# Patient Record
Sex: Male | Born: 1945 | Race: White | Hispanic: No | Marital: Married | Smoking: Never smoker
Health system: Southern US, Community
[De-identification: ages and names within clinical notes are randomized; demographics above are authoritative.]

## PROBLEM LIST (undated history)

## (undated) DIAGNOSIS — E785 Hyperlipidemia, unspecified: Secondary | ICD-10-CM

## (undated) DIAGNOSIS — I10 Essential (primary) hypertension: Secondary | ICD-10-CM

## (undated) DIAGNOSIS — I4891 Unspecified atrial fibrillation: Secondary | ICD-10-CM

## (undated) HISTORY — DX: Essential (primary) hypertension: I10

## (undated) HISTORY — DX: Hyperlipidemia, unspecified: E78.5

## (undated) HISTORY — DX: Unspecified atrial fibrillation: I48.91

---

## 2004-10-20 ENCOUNTER — Ambulatory Visit: Payer: Self-pay | Admitting: Internal Medicine

## 2004-11-03 ENCOUNTER — Ambulatory Visit: Payer: Self-pay | Admitting: Internal Medicine

## 2009-03-10 ENCOUNTER — Telehealth: Payer: Self-pay | Admitting: Internal Medicine

## 2009-03-11 ENCOUNTER — Ambulatory Visit: Payer: Self-pay | Admitting: Gastroenterology

## 2009-03-11 DIAGNOSIS — I4891 Unspecified atrial fibrillation: Secondary | ICD-10-CM | POA: Insufficient documentation

## 2009-03-11 DIAGNOSIS — I10 Essential (primary) hypertension: Secondary | ICD-10-CM | POA: Insufficient documentation

## 2009-03-11 DIAGNOSIS — E0590 Thyrotoxicosis, unspecified without thyrotoxic crisis or storm: Secondary | ICD-10-CM | POA: Insufficient documentation

## 2009-03-13 ENCOUNTER — Telehealth (INDEPENDENT_AMBULATORY_CARE_PROVIDER_SITE_OTHER): Payer: Self-pay

## 2009-03-13 ENCOUNTER — Ambulatory Visit: Payer: Self-pay | Admitting: Physician Assistant

## 2009-03-13 LAB — CONVERTED CEMR LAB
Basophils Absolute: 0 10*3/uL (ref 0.0–0.1)
Eosinophils Absolute: 0.2 10*3/uL (ref 0.0–0.7)
HCT: 27.2 % — ABNORMAL LOW (ref 39.0–52.0)
Lymphs Abs: 1.5 10*3/uL (ref 0.7–4.0)
MCHC: 33.5 g/dL (ref 30.0–36.0)
MCV: 94.4 fL (ref 78.0–100.0)
Monocytes Absolute: 0.6 10*3/uL (ref 0.1–1.0)
Neutrophils Relative %: 69 % (ref 43.0–77.0)
Platelets: 200 10*3/uL (ref 150.0–400.0)
RDW: 12.6 % (ref 11.5–14.6)

## 2009-03-17 ENCOUNTER — Ambulatory Visit: Payer: Self-pay | Admitting: Internal Medicine

## 2009-03-17 LAB — CONVERTED CEMR LAB
BUN: 14 mg/dL (ref 6–23)
Basophils Relative: 0.4 % (ref 0.0–3.0)
Calcium: 8.7 mg/dL (ref 8.4–10.5)
Eosinophils Absolute: 0.2 10*3/uL (ref 0.0–0.7)
Eosinophils Relative: 3.8 % (ref 0.0–5.0)
Eosinophils Relative: 3 % (ref 0.0–5.0)
GFR calc non Af Amer: 103.98 mL/min (ref >60)
Glucose, Bld: 115 mg/dL — ABNORMAL HIGH (ref 70–99)
HCT: 33.5 % — ABNORMAL LOW (ref 39.0–52.0)
Lymphocytes Relative: 19.6 % (ref 12.0–46.0)
Lymphs Abs: 1.5 10*3/uL (ref 0.7–4.0)
MCHC: 34 g/dL (ref 30.0–36.0)
MCV: 94.3 fL (ref 78.0–100.0)
MCV: 93.5 fL (ref 78.0–100.0)
Monocytes Absolute: 0.5 10*3/uL (ref 0.1–1.0)
Monocytes Absolute: 0.7 10*3/uL (ref 0.1–1.0)
Monocytes Relative: 10.1 % (ref 3.0–12.0)
Neutrophils Relative %: 62.8 % (ref 43.0–77.0)
Neutrophils Relative %: 66.4 % (ref 43.0–77.0)
Platelets: 200 10*3/uL (ref 150.0–400.0)
Platelets: 252 10*3/uL (ref 150.0–400.0)
Prothrombin Time: 11.6 s (ref 10.9–13.3)
RBC: 3.56 M/uL — ABNORMAL LOW (ref 4.22–5.81)
RBC: 2.93 M/uL — ABNORMAL LOW (ref 4.22–5.81)
WBC: 6.1 10*3/uL (ref 4.5–10.5)
WBC: 7.3 10*3/uL (ref 4.5–10.5)

## 2009-03-20 ENCOUNTER — Ambulatory Visit: Payer: Self-pay | Admitting: Internal Medicine

## 2009-03-20 DIAGNOSIS — K5731 Diverticulosis of large intestine without perforation or abscess with bleeding: Secondary | ICD-10-CM

## 2009-03-20 DIAGNOSIS — D62 Acute posthemorrhagic anemia: Secondary | ICD-10-CM

## 2009-03-24 ENCOUNTER — Encounter (INDEPENDENT_AMBULATORY_CARE_PROVIDER_SITE_OTHER): Payer: Self-pay

## 2009-03-24 ENCOUNTER — Ambulatory Visit: Payer: Self-pay | Admitting: Internal Medicine

## 2009-03-24 ENCOUNTER — Ambulatory Visit (HOSPITAL_COMMUNITY): Admission: RE | Admit: 2009-03-24 | Discharge: 2009-03-24 | Payer: Self-pay | Admitting: Internal Medicine

## 2009-04-27 ENCOUNTER — Ambulatory Visit: Payer: Self-pay | Admitting: Internal Medicine

## 2009-07-27 LAB — CONVERTED CEMR LAB
Basophils Relative: 0.4 % (ref 0.0–3.0)
Eosinophils Relative: 2.9 % (ref 0.0–5.0)
Hemoglobin: 13 g/dL (ref 13.0–17.0)
MCV: 91.3 fL (ref 78.0–100.0)
Monocytes Absolute: 0.6 10*3/uL (ref 0.1–1.0)
Neutro Abs: 4.1 10*3/uL (ref 1.4–7.7)
Neutrophils Relative %: 63.2 % (ref 43.0–77.0)
RBC: 4.16 M/uL — ABNORMAL LOW (ref 4.22–5.81)
WBC: 6.5 10*3/uL (ref 4.5–10.5)

## 2009-07-28 ENCOUNTER — Ambulatory Visit: Payer: Self-pay | Admitting: Internal Medicine

## 2009-07-29 LAB — CONVERTED CEMR LAB
Basophils Relative: 0.7 % (ref 0.0–3.0)
Eosinophils Relative: 2.1 % (ref 0.0–5.0)
HCT: 41.8 % (ref 39.0–52.0)
Lymphs Abs: 1.7 10*3/uL (ref 0.7–4.0)
MCV: 93.7 fL (ref 78.0–100.0)
Monocytes Absolute: 0.8 10*3/uL (ref 0.1–1.0)
RBC: 4.46 M/uL (ref 4.22–5.81)
WBC: 6.5 10*3/uL (ref 4.5–10.5)

## 2010-08-25 ENCOUNTER — Ambulatory Visit: Payer: Self-pay | Admitting: Cardiovascular Disease

## 2011-01-16 ENCOUNTER — Other Ambulatory Visit: Payer: Self-pay | Admitting: Cardiovascular Disease

## 2011-01-17 ENCOUNTER — Other Ambulatory Visit: Payer: Self-pay | Admitting: Cardiovascular Disease

## 2011-01-17 MED ORDER — PROPRANOLOL HCL 10 MG PO TABS: 10.0000 mg | ORAL_TABLET | Freq: Four times a day (QID) | ORAL | Status: DC | PRN

## 2011-01-17 NOTE — Telephone Encounter (Signed)
Medication refill

## 2011-02-23 ENCOUNTER — Other Ambulatory Visit: Payer: Self-pay | Admitting: Cardiovascular Disease

## 2011-02-23 NOTE — Telephone Encounter (Signed)
Med refill

## 2011-03-13 ENCOUNTER — Other Ambulatory Visit: Payer: Self-pay | Admitting: Cardiovascular Disease

## 2011-03-14 NOTE — Telephone Encounter (Signed)
Med refill

## 2011-04-15 ENCOUNTER — Telehealth: Payer: Self-pay | Admitting: Cardiovascular Disease

## 2011-04-15 NOTE — Telephone Encounter (Signed)
Called patient regarding 02/2011 appointment.  Patient states doing well, did not want to schedule at this time.  Told patient I would add to recall list for 3 more months out to notify.  Patient ok and will call if he needs anything.

## 2011-04-26 ENCOUNTER — Ambulatory Visit (INDEPENDENT_AMBULATORY_CARE_PROVIDER_SITE_OTHER): Payer: BC Managed Care – PPO | Admitting: Cardiovascular Disease

## 2011-04-26 ENCOUNTER — Encounter: Payer: Self-pay | Admitting: Cardiovascular Disease

## 2011-04-26 ENCOUNTER — Telehealth: Payer: Self-pay | Admitting: Cardiovascular Disease

## 2011-04-26 ENCOUNTER — Inpatient Hospital Stay (HOSPITAL_COMMUNITY): Payer: BC Managed Care – PPO

## 2011-04-26 ENCOUNTER — Inpatient Hospital Stay (HOSPITAL_COMMUNITY)
Admission: AD | Admit: 2011-04-26 | Discharge: 2011-04-29 | DRG: 112 | Disposition: A | Discharge status: Home / Self Care | Payer: BC Managed Care – PPO | Source: Ambulatory Visit | Attending: Cardiovascular Disease | Admitting: Cardiovascular Disease

## 2011-04-26 DIAGNOSIS — I10 Essential (primary) hypertension: Secondary | ICD-10-CM | POA: Diagnosis present

## 2011-04-26 DIAGNOSIS — I4892 Unspecified atrial flutter: Secondary | ICD-10-CM

## 2011-04-26 DIAGNOSIS — I4891 Unspecified atrial fibrillation: Secondary | ICD-10-CM | POA: Diagnosis present

## 2011-04-26 DIAGNOSIS — M1990 Unspecified osteoarthritis, unspecified site: Secondary | ICD-10-CM | POA: Diagnosis present

## 2011-04-26 DIAGNOSIS — I951 Orthostatic hypotension: Secondary | ICD-10-CM | POA: Diagnosis present

## 2011-04-26 DIAGNOSIS — E785 Hyperlipidemia, unspecified: Secondary | ICD-10-CM | POA: Diagnosis present

## 2011-04-26 DIAGNOSIS — Z7982 Long term (current) use of aspirin: Secondary | ICD-10-CM

## 2011-04-26 LAB — COMPREHENSIVE METABOLIC PANEL WITH GFR
ALT: 27 U/L (ref 0–53)
AST: 18 U/L (ref 0–37)
Albumin: 3.7 g/dL (ref 3.5–5.2)
Alkaline Phosphatase: 66 U/L (ref 39–117)
BUN: 18 mg/dL (ref 6–23)
CO2: 27 meq/L (ref 19–32)
Calcium: 9.2 mg/dL (ref 8.4–10.5)
Chloride: 104 meq/L (ref 96–112)
Creatinine, Ser: 0.74 mg/dL (ref 0.50–1.35)
GFR calc Af Amer: >60 mL/min
GFR calc non Af Amer: >60 mL/min
Glucose, Bld: 111 mg/dL — ABNORMAL HIGH (ref 70–99)
Potassium: 4.1 meq/L (ref 3.5–5.1)
Sodium: 140 meq/L (ref 135–145)
Total Bilirubin: 0.7 mg/dL (ref 0.3–1.2)
Total Protein: 7.6 g/dL (ref 6.0–8.3)

## 2011-04-26 LAB — CBC
HCT: 46.9 % (ref 39.0–52.0)
Hemoglobin: 16.4 g/dL (ref 13.0–17.0)
MCH: 32.4 pg (ref 26.0–34.0)
MCHC: 35 g/dL (ref 30.0–36.0)
MCV: 92.7 fL (ref 78.0–100.0)
Platelets: 187 K/uL (ref 150–400)
RBC: 5.06 MIL/uL (ref 4.22–5.81)
RDW: 13.4 % (ref 11.5–15.5)
WBC: 9.4 K/uL (ref 4.0–10.5)

## 2011-04-26 NOTE — Telephone Encounter (Signed)
Pt said his heart rate has not been right last couple of days and his pulse is about 150 he wants to see dr Elease Hashimoto as soon as possible

## 2011-04-26 NOTE — Telephone Encounter (Signed)
Propranolol been used without helping. App made.

## 2011-04-26 NOTE — Progress Notes (Signed)
[name] Date of Birth  03-19-1946 [facility] / [facility] [address]     [address] [place], [place]  [address] [phone]  Fax  [phone]  History of Present Illness:  65 year old gentleman with a history of intermittent atrial fibrillation.     He presents now with a three-day history of tachycardia palpitations. We have increased his Toprol from 50 mg once a day to 50 mg twice a day and is also taken some Inderal on his own. His tachycardia persists. He denies any chest pain or shortness breath. He has some slight orthostatic hypotension/dizziness.                                                                                                                                                                                                                                                                   he's had lots of diaphoresis. He's also had some heat intolerance.  He's had some weight loss but he's also been on a diet in an effort to lose weight.    He denies any other constitutional symptoms.   He has a history of atrial fibrillation in the past.                                                                                                                                                                 Current Outpatient Prescriptions on File Prior to Visit  Medication Sig Dispense Refill  . digoxin (LANOXIN) 0.25 MG tablet TAKE 1 TABLET BY MOUTH EVERY  DAY  90 tablet  3  . metoprolol (LOPRESSOR) 50 MG tablet TAKE 1 TABLET BY MOUTH TWICE A DAY  180 tablet  3  . propranolol (INDERAL) 10 MG tablet Take 1 tablet (10 mg total) by mouth 4 (four) times daily as needed.  30 tablet  11    No Known Allergies  Past Medical History  Diagnosis Date  . Hyperlipidemia   . Hypertension   . Atrial fibrillation     HISTORY OF INTERMITTENT ATRIAL FIBRILLATION--    No past surgical history on file.  History  Smoking status  . Never Smoker   Smokeless  tobacco  . Not on file    History  Alcohol Use  . Yes    Family History  Problem Relation Age of Onset  . Hypertension Mother   . Hypertension Father     Reviw of Systems:  Reviewed in the HPI.  All other systems are negative.  Physical Exam: Pulse 98  Ht 5\' 11"  (1.803 m)  Wt 235 lb 3.2 oz (106.686 kg)  BMI 32.80 kg/m2 The patient is alert and oriented x 3.  The mood and affect are normal.   Skin: warm and dry.  Color is normal.    HEENT:   the sclera are nonicteric.  The mucous membranes are moist.  The carotids are 2+ without bruits.  There is no thyromegaly.  There is no JVD.    Lungs: clear.  The chest wall is non tender.    Heart: regular rate with a normal S1 and S2. He's very tachycardic. There are no murmurs, gallops, or rubs. The PMI is not displaced.     Abdomen: good bowel sounds.  There is no guarding or rebound.  There is no hepatosplenomegaly or tenderness.  There are no masses.   Extremities:  no clubbing, cyanosis, or edema.  The legs are without rashes.  The distal pulses are intact.   Neuro:  Cranial nerves II - XII are intact.  Motor and sensory functions are intact.    The gait is normal.  ECG: Atrial flutter with a 2-1 block. He has no ST or T wave changes.  Assessment / Plan:

## 2011-04-26 NOTE — Assessment & Plan Note (Signed)
His blood pressure is fairly well tolerated. We'll continue with the same medications for now.

## 2011-04-26 NOTE — Assessment & Plan Note (Signed)
He presents today with 3 days of tachycardia palpitations. His heart rate has been around 150 [place] he. We now have found he is in atrial flutter. He has a history of atrial fibrillation in the past.  I discussed the case with Dr. Johney Frame. We'll going to give him a dose of per DAX 150 mg today in the office. We'll start him on heparin drip. Dr. Johney Frame will see him later today and will consider atrial flutter ablation on Thursday. We'll need to do a transesophageal echo tomorrow.  We'll consider antiarrhythmic therapy versus cardioversion versus atrial flutter ablation.

## 2011-04-27 LAB — CBC
HCT: 40.7 % (ref 39.0–52.0)
Hemoglobin: 14 g/dL (ref 13.0–17.0)
MCH: 32.4 pg (ref 26.0–34.0)
MCV: 94.2 fL (ref 78.0–100.0)
Platelets: 176 10*3/uL (ref 150–400)
RBC: 4.32 MIL/uL (ref 4.22–5.81)
WBC: 7.9 10*3/uL (ref 4.0–10.5)

## 2011-04-28 ENCOUNTER — Encounter: Payer: Self-pay | Admitting: *Deleted

## 2011-04-28 LAB — CBC
HCT: 41.4 % (ref 39.0–52.0)
Hemoglobin: 13.8 g/dL (ref 13.0–17.0)
MCH: 31.2 pg (ref 26.0–34.0)
MCHC: 33.3 g/dL (ref 30.0–36.0)
MCV: 93.5 fL (ref 78.0–100.0)
RDW: 13.3 % (ref 11.5–15.5)

## 2011-04-28 LAB — PROTIME-INR: INR: 1.31 (ref 0.00–1.49)

## 2011-04-28 LAB — APTT: aPTT: 45 seconds — ABNORMAL HIGH (ref 24–37)

## 2011-04-29 ENCOUNTER — Encounter: Payer: Self-pay | Admitting: *Deleted

## 2011-04-29 ENCOUNTER — Other Ambulatory Visit: Payer: Self-pay | Admitting: *Deleted

## 2011-04-29 LAB — CBC
Hemoglobin: 13.9 g/dL (ref 13.0–17.0)
MCH: 31.1 pg (ref 26.0–34.0)
MCHC: 33.1 g/dL (ref 30.0–36.0)
RDW: 13.4 % (ref 11.5–15.5)

## 2011-04-29 MED ORDER — DABIGATRAN ETEXILATE MESYLATE 150 MG PO CAPS: 150.0000 mg | ORAL_CAPSULE | Freq: Two times a day (BID) | ORAL | Status: DC

## 2011-05-03 ENCOUNTER — Other Ambulatory Visit (HOSPITAL_COMMUNITY): Payer: Self-pay | Admitting: Radiology

## 2011-05-03 ENCOUNTER — Ambulatory Visit (HOSPITAL_COMMUNITY): Payer: BC Managed Care – PPO | Attending: Internal Medicine | Admitting: Radiology

## 2011-05-03 DIAGNOSIS — I4892 Unspecified atrial flutter: Secondary | ICD-10-CM

## 2011-05-03 DIAGNOSIS — I4891 Unspecified atrial fibrillation: Secondary | ICD-10-CM | POA: Insufficient documentation

## 2011-05-03 DIAGNOSIS — E785 Hyperlipidemia, unspecified: Secondary | ICD-10-CM | POA: Insufficient documentation

## 2011-05-05 ENCOUNTER — Telehealth: Payer: Self-pay | Admitting: *Deleted

## 2011-05-05 NOTE — Telephone Encounter (Signed)
Patient called with normal echo results. Pt verbalized understanding. Alfonso Ramus RN

## 2011-05-05 NOTE — Telephone Encounter (Signed)
Message copied by Antony Odea on Thu May 05, 2011  3:11 PM ------      Message from: Vesta Mixer      Created: Wed May 04, 2011  1:34 PM       Echo is OK

## 2011-05-05 NOTE — Consult Note (Signed)
Troy Cooke, Troy NO.:  [id number]  MEDICAL RECORD NO.:  [id number]  LOCATION:  2921                         FACILITY:  [facility]  PHYSICIAN:  Hillis Range, MD       DATE OF BIRTH:  Apr 07, 1946  DATE OF CONSULTATION:  04/26/2011 DATE OF DISCHARGE:                                CONSULTATION   REQUESTING PHYSICIAN:  Vesta Mixer, MD  REASON FOR CONSULTATION:  Atrial flutter.  HISTORY OF PRESENT ILLNESS:  Troy Cooke is a pleasant 66 year old gentleman with a history of paroxysmal atrial fibrillation, hypertension and hyperlipidemia who now presents with symptomatic atrial flutter.  He reports initially being diagnosed with atrial fibrillation in 2010, after developing tachy palpitations while playing golf.  He reports minimal episodes of atrial fibrillation since that time.  He takes metoprolol daily and has not previously tried an antiarrhythmic drug. He reports that he occasionally has atrial fibrillation typically lasting several hours and resolving with p.r.n. Inderal use.  He states that episodes may occur every 3-4 months and typically last only a couple of hours.  He is unaware of any triggers or precipitants for these episodes.  He does drink daily alcohol.  His spouse also notes that he snores at night and appears to have apneic spells.  He reports that 3-4 days ago he developed tachy palpitations.  He took Inderal as he typically would and his heart rate did not slow down.  He continued to have tachy palpitations with subsequent fatigue.  He, therefore, presented to Dr. Harvie Bridge office and was found to have atrial flutter with a rapid ventricular rate.  He is, therefore, admitted for further evaluation.  He was initiated on Pradaxa 150 mg b.i.d. in Dr. Harvie Bridge office and has received one dose earlier today.  He has been placed on a Cardizem drip and presently his heart rates are improved. He denies chest pain, shortness of breath, dizziness,  presyncope or syncope.  He is otherwise without complaint at this time.  PAST MEDICAL HISTORY: 1. Hypertension. 2. Paroxysmal atrial fibrillation. 3. Hyperlipidemia. 4. Degenerative joint disease.  PAST SURGICAL HISTORY:  Prior bilateral knee arthroscopy and bilateral heel surgeries.  HOME MEDICATIONS:  Reviewed in the [facility].  ALLERGIES:  None.  SOCIAL HISTORY:  The patient is married and lives in [place].  He drinks several alcoholic beverages daily.  He denies alcohol or drug use.  FAMILY HISTORY:  Notable for hypertension.  REVIEW OF SYSTEMS:  All systems were reviewed and negative except as outlined in the HPI above.  PHYSICAL EXAMINATION:  Telemetry reveals atrial flutter with 4-1 AV conduction. VITALS:  Blood pressure 128/79, heart rate 76, respirations 20 and sats 98% on room air. GENERAL:  The patient is a well-appearing male, in no acute distress. He is alert and oriented x3. HEENT:  Normocephalic, atraumatic.  Sclerae clear.  Conjunctivae pink. Oropharynx clear. NECK:  Supple.  JVP 7-cm. LUNGS:  Clear. HEART:  Tachycardic, irregular rhythm.  No murmurs, rubs or gallops. GI:  Soft, nontender and nondistended.  Positive bowel sounds. EXTREMITIES:  No clubbing, cyanosis or edema. SKIN:  No ecchymoses or lacerations. MUSCULOSKELETAL:  No deformity or atrophy. PSYCH:  Euthymic mood.  Full affect. NEURO:  Strength and sensation are intact.  EKG reveals typical-appearing atrial flutter with 2:1 AV conduction.  LABORATORY DATA:  Potassium 4.1, BUN 18, creatinine 0.7, hematocrit 46 and platelets 187.  Echocardiogram from 2010, revealed a normal left ventricular ejection fraction with a left atrial size of 38-mm and mild left ventricular hypertrophy, trace MR and TR were observed.  IMPRESSION:  Troy Cooke is a pleasant 65 year old gentleman with a history of paroxysmal atrial fibrillation and hypertension who now presents with sustained typical-appearing atrial  flutter with rapid ventricular rates. I had a long discussion with the patient today regarding both atrial fibrillation and atrial flutter therapies.  As his atrial fibrillation has been reasonably well-controlled, I think that we would continue his current strategy.  If his atrial fibrillation increased in the future, then we should consider an antiarrhythmic drug at that time.  As his atrial flutter is difficult to rate control at this time, I think that we should indeed proceed with catheter ablation for his atrial flutter. Risks, benefits and alternatives to EP study and radiofrequency ablation for atrial flutter were discussed at length with the patient and his spouse today.  He understands that the risk include, but are not limited to stroke, bleeding, vascular damage, perforation, tamponade, AV block requiring a pacemaker MI and death.  He accepts these risks and wishes to proceed.  We will, therefore, continue Pradaxa 150 mg twice daily. Once he has had 5 doses, we will proceed with transesophageal echocardiogram guided ablation for his atrial flutter.  We will then follow his atrial fibrillation clinically and adjust medications as needed.     Hillis Range, MD     JA/MEDQ  D:  04/26/2011  T:  04/27/2011  Job:  [id number]  cc:   Vesta Mixer, M.D.  Electronically Signed by Hillis Range MD on 05/05/2011 09:44:35 AM

## 2011-05-13 LAB — PULMONARY FUNCTION TEST

## 2011-06-01 ENCOUNTER — Encounter: Payer: Self-pay | Admitting: Internal Medicine

## 2011-06-01 ENCOUNTER — Ambulatory Visit (INDEPENDENT_AMBULATORY_CARE_PROVIDER_SITE_OTHER): Payer: BC Managed Care – PPO | Admitting: Internal Medicine

## 2011-06-01 DIAGNOSIS — I10 Essential (primary) hypertension: Secondary | ICD-10-CM

## 2011-06-01 DIAGNOSIS — I4891 Unspecified atrial fibrillation: Secondary | ICD-10-CM

## 2011-06-01 DIAGNOSIS — I4892 Unspecified atrial flutter: Secondary | ICD-10-CM

## 2011-06-01 NOTE — Assessment & Plan Note (Signed)
His symptoms have resolved. He will continue his current medications. He is maintaining sinus rhythm.

## 2011-06-01 NOTE — Assessment & Plan Note (Signed)
His blood pressure is well controlled. He will maintain a low sodium diet and I have asked him to increase his physical activity. Weight loss is recommended.

## 2011-06-01 NOTE — Patient Instructions (Signed)
Your physician recommends that you schedule a follow-up appointment as needed  

## 2011-06-01 NOTE — Progress Notes (Signed)
HPI Mr. Hagy returns today for followup. He is a very pleasant 65 year old male with a history of atrial flutter who underwent catheter ablation several weeks ago. He has done well since. He has had remote atrial fibrillation as well since his ablation has had no palpitations. The patient denies chest pain, shortness of breath, syncope, or peripheral edema. He noted minimal pain at the right groin insertion site after ablation but this is resolved. No Known Allergies   Current Outpatient Prescriptions  Medication Sig Dispense Refill  . aspirin 81 MG tablet Take 81 mg by mouth daily.        . citalopram (CELEXA) 20 MG tablet Take 20 mg by mouth daily. 1/2 DAILY       . digoxin (LANOXIN) 0.25 MG tablet TAKE 1 TABLET BY MOUTH EVERY DAY  90 tablet  3  . hydrochlorothiazide 25 MG tablet Take 25 mg by mouth daily.        . IBUPROFEN PO Take by mouth as needed.        Marland Kitchen lisinopril (PRINIVIL,ZESTRIL) 20 MG tablet Take 20 mg by mouth daily.        . metoprolol (LOPRESSOR) 50 MG tablet TAKE 1 TABLET BY MOUTH TWICE A DAY  180 tablet  3  . potassium chloride (KLOR-CON) 10 MEQ CR tablet Take 10 mEq by mouth daily.        . pravastatin (PRAVACHOL) 40 MG tablet Take 40 mg by mouth daily.        . propranolol (INDERAL) 10 MG tablet Take 1 tablet (10 mg total) by mouth 4 (four) times daily as needed.  30 tablet  11     Past Medical History  Diagnosis Date  . Hyperlipidemia   . Hypertension   . Atrial fibrillation     HISTORY OF INTERMITTENT ATRIAL FIBRILLATION--    ROS:   All systems reviewed and negative except as noted in the HPI.   No past surgical history on file.   Family History  Problem Relation Age of Onset  . Hypertension Mother   . Hypertension Father      History   Social History  . Marital Status: Married    Spouse Name: N/A    Number of Children: N/A  . Years of Education: N/A   Occupational History  . Not on file.   Social History Main Topics  . Smoking status:  Never Smoker   . Smokeless tobacco: Not on file  . Alcohol Use: Yes  . Drug Use: No  . Sexually Active: Not on file   Other Topics Concern  . Not on file   Social History Narrative  . No narrative on file     BP 124/88  Pulse 49  Ht 5\' 11"  (1.803 m)  Wt 234 lb (106.142 kg)  BMI 32.64 kg/m2  Physical Exam:  Well appearing middle aged man, NAD HEENT: Unremarkable Neck:  No JVD, no thyromegally Lymphatics:  No adenopathy Back:  No CVA tenderness Lungs:  Clear with no wheezes, rales, or rhonchi. HEART:  Regular rate rhythm, no murmurs, no rubs, no clicks Abd: obese, soft, positive bowel sounds, no organomegally, no rebound, no guarding Ext:  2 plus pulses, no edema, no cyanosis, no clubbing Skin:  No rashes no nodules Neuro:  CN II through XII intact, motor grossly intact  EKG Normal sinus rhythm   Assess/Plan:

## 2011-06-01 NOTE — Assessment & Plan Note (Signed)
He has had no recurrent symptoms since his catheter ablation. We will undergo a period of watchful waiting.

## 2011-06-09 NOTE — Progress Notes (Signed)
Addended by: Burnett Kanaris A on: 06/09/2011 01:49 PM   Modules accepted: Orders

## 2011-06-23 NOTE — Discharge Summary (Signed)
NAMESHINE, MIKES NO.:  [id number]  MEDICAL RECORD NO.:  [id number]  LOCATION:  2021                         FACILITY:  [facility]  PHYSICIAN:  Doylene Canning. Ladona Ridgel, MD    DATE OF BIRTH:  [birth year]  DATE OF ADMISSION:  04/26/2011 DATE OF DISCHARGE:  04/29/2011                              DISCHARGE SUMMARY   PRIMARY CARE PHYSICIAN:  Dr. Marjory Lies at [facility].  PRIMARY CARDIOLOGIST:  Vesta Mixer, MD  ELECTROPHYSIOLOGIST:  New with Doylene Canning. Ladona Ridgel, MD  PRIMARY DIAGNOSIS:  Symptomatic atrial flutter.  SECONDARY DIAGNOSES: 1. Paroxysmal atrial fibrillation. 2. Hypertension. 3. Hyperlipidemia. 4. Degenerative joint disease.  ALLERGIES:  The patient has no known drug allergies.  PROCEDURES THIS ADMISSION: 1. Chest x-ray on April 26, 2011 demonstrated no acute findings. 2. Electrophysiology study and radiofrequency catheter ablation of     atrial flutter on April 28, 2011 by Dr. Ladona Ridgel.  This study     demonstrated successful EP study and catheter ablation of inducible     atrial flutter.  This resulted in creation of bidirectional block     across the atrial flutter isthmus.  The patient had no early     apparent complications.  BRIEF HISTORY OF PRESENT ILLNESS:  Mr. Troy Cooke is a 65 year old male with history of paroxysmal atrial fibrillation dating back to 2010.  This was diagnosed after developing tachy palpitations while playing golf.  He has minimal episodes of atrial fibrillation since that time that is controlled with p.r.n. Inderal.  He was in his usual state of health until 3-4 days before admission and he noticed tachy palpitations.  He took Inderal but his heart rate did not slow down.  He continued to have tachy palpitations with subsequent fatigue.  He presented to Dr. Harvie Bridge office for evaluation.  He was found to be in atrial flutter with rapid ventricular response.  He was therefore admitted for  further evaluations.  HOSPITAL COURSE:  The patient was admitted on April 26, 2011 for atrial flutter with rapid ventricular response.  He was evaluated by Dr. Johney Frame who recommended beginning Pradaxa 150 mg twice daily followed by transesophageal echocardiogram guided atrial flutter ablation after 5 doses.  The patient converted spontaneously to sinus rhythm on April 27, 2011 and therefore the need for TEE was obviated.  He underwent ablation by Dr. Ladona Ridgel on August 16 with details as outlined above.  He was monitored on telemetry which demonstrated sinus rhythm.  He was examined by Dr. Ladona Ridgel on April 29, 2011 and considered stable for discharge.  Recommendations at the time of discharge included continuing Pradaxa for 4 weeks.  The patient will hold his aspirin during this time.  It is also recommended the patient undergo an outpatient sleep study test as his wife reports snoring.  The patient should also have an outpatient echocardiogram and this will be scheduled with [facility].  FOLLOWUP APPOINTMENTS: 1. Dr. Ladona Ridgel on June 01, 2011 at 10:15 a.m. 2. Dr. Elease Hashimoto in 3 months - the office will call and schedule that     appointment. 3. Dr. Doristine Counter as scheduled. 4. Outpatient sleep study - the office will call  and schedule this     appointment. 5. Outpatient echocardiogram - the office will call to schedule this     appointment.  DISCHARGE INSTRUCTIONS: 1. Increase activity slowly. 2. No driving for 2 days. 3. Follow a low sodium heart-healthy diet. 4. Abstain from all alcohol. 5. Keep incisions clean and dry.  DISCHARGE MEDICATIONS: 1. Pradaxa 150 mg 1 tablet twice daily for 4 weeks. 2. Advil 200 mg 1 tablet every 6 hours as needed for pain. 3. Aspirin 81 mg 1 tablet daily - the patient will hold this while on     Pradaxa and then resume. 4. Calcium/vitamin D daily. 5. Citalopram 20 mg daily. 6. Digoxin 0.25 mg daily. 7. Lisinopril 20 mg daily. 8.  Metoprolol tartrate 50 mg 1 tablet twice daily. 9. Multivitamin daily. 10.Klor-Con 10 mEq daily. 11.Pravastatin 40 mg daily at bedtime. 12.Propranolol 10 mg 1 tablet 4 times daily as needed for     palpitations.  DISPOSITION:  The patient was seen and examined by Dr. Ladona Ridgel on April 29, 2011 and considered stable for discharge.  DURATION OF DISCHARGE ENCOUNTER:  35 minutes.     Gypsy Balsam, RN,BSN   ______________________________ Doylene Canning. Ladona Ridgel, MD    AS/MEDQ  D:  04/29/2011  T:  04/29/2011  Job:  [id number]  cc:   Vesta Mixer, M.D. Marjory Lies, M.D.  Electronically Signed by Gypsy Balsam RNBSN on 04/29/2011 01:32:04 PM Electronically Signed by Lewayne Bunting MD on 06/23/2011 06:39:02 PM

## 2011-06-23 NOTE — Op Note (Signed)
NAMEPORTER, MOES NO.:  [id number]  MEDICAL RECORD NO.:  [id number]  LOCATION:  2021                         FACILITY:  [facility]  PHYSICIAN:  Doylene Canning. Ladona Ridgel, MD    DATE OF BIRTH:  June 14, 1946  DATE OF PROCEDURE:  04/28/2011 DATE OF DISCHARGE:                              OPERATIVE REPORT   PROCEDURE PERFORMED:  Electrophysiologic study and RF catheter ablation of atrial flutter.  INTRODUCTION:  The patient is a 65 year old man who is admitted to the hospital with symptomatic atrial flutter and 2:1 AV conduction.  He was placed on Pradaxa.  His flutter terminated spontaneously just prior to TEE and he is now referred for catheter ablation.  PROCEDURE:  After informed consent was obtained, the patient was taken to the diagnostic EP lab in a fasting state.  After usual preparation and draping, intravenous fentanyl and midazolam was given for sedation. A 6-French octapolar catheter was inserted percutaneously in the right femoral vein and advanced into the coronary sinus.  A 6-French quadripolar catheter was inserted percutaneously in the right femoral vein and advanced to the His bundle region.  Rapid atrial pacing was initially carried out from the coronary sinus on the lateral wall of the left atrium and this resulted in the initiation of a left atrial flutter/fibrillation.  This arrhythmia was irregular and the flutter cycle length was quite variable.  In addition, the activation sequence was constantly changing.  The patient was sedated and cardioverted restoring sinus rhythm.  Next, rapid atrial pacing was carried out from the right atrium, resulting in the initiation of typical atrial flutter. EKG demonstrated counterclockwise tricuspid annular reentry.  The 7- French quadripolar ablation catheter was then maneuvered into the atrial flutter isthmus.  Nine RF energy applications were delivered between the tricuspid valve annulus and the inferior vena cava  at sites between 6 and 7 o'clock in the LAO projection along the tricuspid valve back to the inferior vena cava.  During the second RF energy application, atrial flutter was terminated and sinus rhythm restored.  During the eighth RF energy application, isthmus block was demonstrated and a single bonus RF energy application was delivered.  The patient was observed for approximately 30 minutes.  During this time, rapid atrial and ventricular pacing was carried out from the right ventricle and right atrium.  Programmed atrial and ventricular stimulation were carried out and there were no inducible arrhythmias.  The patient was then returned to his room in satisfactory condition following removal of the catheters.  COMPLICATIONS:  There were no immediate procedure complications.  RESULTS:  A.  Baseline ECG.  Baseline ECG demonstrates sinus rhythm with normal axis intervals. B.  Baseline interval.  Sinus node cycle length was 980 milliseconds. QRS duration was 100 milliseconds.  PR interval was 170 milliseconds. HV interval was 33 milliseconds.  The AH interval was 103 milliseconds. C.  Rapid ventricular pacing.  Rapid ventricular pacing was carried out from the right ventricle demonstrating VA dissociation. D.  Programmed ventricular stimulation.  Programmed ventricular stimulation was carried out from the right ventricle demonstrating VA dissociation. E.  Programmed atrial stimulation.  Programmed atrial stimulation was carried out from the  coronary sinus and the right atrium at a base drive cycle length of 161 milliseconds and stepwise decreased down to 240 milliseconds where atrial refractoriness was observed.  During programmed atrial stimulation, there were no AH jumps or echo beats but there was PR interval greater than the RR interval. F.  Rapid atrial pacing.  Rapid atrial pacing was carried out from the coronary sinus and high right atrium with a pacing cycle length of  600 milliseconds and stepwise decreased down to 210 milliseconds resulting in initiation of left atrial flutter. G.  Arrhythmias Observed: 1. Atrial flutter,  initiation was with rapid atrial pacing.  Duration     sustained.  Termination was catheter ablation. 2. Atrial fibrillation/left atrial flutter.  Initiation was with rapid     atrial pacing.  Duration was sustained.  Termination was with DC     cardioversion.     a.     Mapping.  Mapping of the atrial flutter isthmus demonstrated      usual size and orientation of the atrial flutter isthmus.     b.     RF energy application.  A total of nine RF energy      applications were delivered in the usual atrial flutter isthmus      resulting in termination of flutter, restoration of sinus rhythm,      and creation of bidirectional block in atrial flutter isthmus.  CONCLUSIONS:  This study demonstrates successful electrophysiologic study and RF catheter ablation of inducible atrial flutter with total of nine RF energy applications delivered to the usual atrial flutter isthmus resulting in termination of flutter, restoration of sinus rhythm, and creation of bidirectional block in the atrial flutter isthmus.     Doylene Canning. Ladona Ridgel, MD     GWT/MEDQ  D:  04/28/2011  T:  04/29/2011  Job:  [id number]  cc:   Vesta Mixer, M.D.  Electronically Signed by Lewayne Bunting MD on 06/23/2011 06:38:57 PM

## 2011-07-09 ENCOUNTER — Other Ambulatory Visit: Payer: Self-pay | Admitting: Cardiovascular Disease

## 2011-07-11 ENCOUNTER — Other Ambulatory Visit: Payer: Self-pay | Admitting: *Deleted

## 2011-07-11 NOTE — Telephone Encounter (Signed)
Per telephone call with Ronaldo Miyamoto at [facility], KLOR-CON M10 does not need to be refilled. Opened in Error.

## 2011-08-20 ENCOUNTER — Other Ambulatory Visit: Payer: Self-pay | Admitting: Cardiovascular Disease

## 2011-08-22 NOTE — Telephone Encounter (Signed)
Fax Received. Refill Completed. Troy Cooke (R.M.A)   

## 2012-01-23 ENCOUNTER — Encounter: Payer: Self-pay | Admitting: Internal Medicine

## 2012-02-27 ENCOUNTER — Other Ambulatory Visit: Payer: Self-pay | Admitting: *Deleted

## 2012-02-27 MED ORDER — DIGOXIN 250 MCG PO TABS: 250.0000 ug | ORAL_TABLET | Freq: Every day | ORAL | Status: DC

## 2012-02-27 NOTE — Telephone Encounter (Signed)
Pt needs appointment then refill can be made Fax Received. Refill Completed. Troy Cooke (R.M.A)   

## 2012-03-07 ENCOUNTER — Other Ambulatory Visit: Payer: Self-pay | Admitting: *Deleted

## 2012-03-07 MED ORDER — CITALOPRAM HYDROBROMIDE 20 MG PO TABS: 20.0000 mg | ORAL_TABLET | Freq: Every day | ORAL | Status: DC

## 2012-03-07 MED ORDER — METOPROLOL TARTRATE 50 MG PO TABS: 50.0000 mg | ORAL_TABLET | Freq: Two times a day (BID) | ORAL | Status: DC

## 2012-03-07 MED ORDER — HYDROCHLOROTHIAZIDE 25 MG PO TABS: 25.0000 mg | ORAL_TABLET | Freq: Every day | ORAL | Status: DC

## 2012-03-07 NOTE — Telephone Encounter (Signed)
Pt needs appointment then refill can be made Fax Received. Refill Completed. Airabella Barley Chowoe (R.M.A)   

## 2012-05-12 ENCOUNTER — Other Ambulatory Visit: Payer: Self-pay | Admitting: Cardiovascular Disease

## 2012-05-15 NOTE — Telephone Encounter (Signed)
Pt needs appointment then refill can be made Fax Received. Refill Completed. Troy Cooke (R.M.A)   

## 2012-05-25 ENCOUNTER — Other Ambulatory Visit: Payer: Self-pay | Admitting: Cardiovascular Disease

## 2012-05-31 ENCOUNTER — Encounter: Payer: Self-pay | Admitting: Cardiovascular Disease

## 2012-06-05 ENCOUNTER — Other Ambulatory Visit: Payer: Self-pay | Admitting: Cardiovascular Disease

## 2012-07-05 ENCOUNTER — Other Ambulatory Visit: Payer: Self-pay | Admitting: *Deleted

## 2012-07-05 MED ORDER — METOPROLOL TARTRATE 50 MG PO TABS: 50.0000 mg | ORAL_TABLET | Freq: Two times a day (BID) | ORAL | Status: DC

## 2012-07-05 MED ORDER — POTASSIUM CHLORIDE ER 10 MEQ PO TBCR: 10.0000 meq | EXTENDED_RELEASE_TABLET | Freq: Every day | ORAL | Status: DC

## 2012-07-05 NOTE — Telephone Encounter (Signed)
Fax Received. Refill Completed. Troy Cooke (R.M.A)  Pt needs appointment then refill can be made  

## 2012-08-06 ENCOUNTER — Ambulatory Visit (INDEPENDENT_AMBULATORY_CARE_PROVIDER_SITE_OTHER): Payer: Medicare Other | Admitting: Cardiovascular Disease

## 2012-08-06 ENCOUNTER — Encounter: Payer: Self-pay | Admitting: Cardiovascular Disease

## 2012-08-06 VITALS — BP 150/90 | HR 89 | Ht 71.0 in | Wt 240.0 lb

## 2012-08-06 DIAGNOSIS — I4891 Unspecified atrial fibrillation: Secondary | ICD-10-CM

## 2012-08-06 DIAGNOSIS — E785 Hyperlipidemia, unspecified: Secondary | ICD-10-CM

## 2012-08-06 DIAGNOSIS — I10 Essential (primary) hypertension: Secondary | ICD-10-CM

## 2012-08-06 LAB — HEPATIC FUNCTION PANEL
Albumin: 3.8 g/dL (ref 3.5–5.2)
Alkaline Phosphatase: 53 U/L (ref 39–117)
Total Bilirubin: 0.7 mg/dL (ref 0.3–1.2)

## 2012-08-06 LAB — LIPID PANEL
HDL: 37.2 mg/dL — ABNORMAL LOW (ref >39.00)
LDL Cholesterol: 95 mg/dL (ref 0–99)
Total CHOL/HDL Ratio: 4
Triglycerides: 165 mg/dL — ABNORMAL HIGH (ref 0.0–149.0)

## 2012-08-06 LAB — BASIC METABOLIC PANEL
CO2: 29 mEq/L (ref 19–32)
Chloride: 102 mEq/L (ref 96–112)
Creatinine, Ser: 1 mg/dL (ref 0.4–1.5)
Glucose, Bld: 139 mg/dL — ABNORMAL HIGH (ref 70–99)

## 2012-08-06 MED ORDER — POTASSIUM CHLORIDE CRYS ER 10 MEQ PO TBCR: 10.0000 meq | EXTENDED_RELEASE_TABLET | Freq: Every day | ORAL | Status: DC

## 2012-08-06 MED ORDER — METOPROLOL TARTRATE 50 MG PO TABS: 50.0000 mg | ORAL_TABLET | Freq: Two times a day (BID) | ORAL | Status: DC

## 2012-08-06 MED ORDER — DIGOXIN 250 MCG PO TABS: 0.2500 mg | ORAL_TABLET | Freq: Every day | ORAL | Status: DC

## 2012-08-06 MED ORDER — LISINOPRIL 20 MG PO TABS: 20.0000 mg | ORAL_TABLET | Freq: Every day | ORAL | Status: DC

## 2012-08-06 MED ORDER — ASPIRIN 325 MG PO TBEC: 325.0000 mg | DELAYED_RELEASE_TABLET | Freq: Every day | ORAL | Status: DC

## 2012-08-06 MED ORDER — PRAVASTATIN SODIUM 40 MG PO TABS: 40.0000 mg | ORAL_TABLET | Freq: Every day | ORAL | Status: DC

## 2012-08-06 MED ORDER — HYDROCHLOROTHIAZIDE 25 MG PO TABS: 25.0000 mg | ORAL_TABLET | Freq: Every day | ORAL | Status: AC

## 2012-08-06 NOTE — Patient Instructions (Addendum)
Your physician wants you to follow-up in: 6 months  You will receive a reminder letter in the mail two months in advance. If you don't receive a letter, please call our office to schedule the follow-up appointment.  Your physician recommends that you return for a FASTING lipid profile: today  Your physician has recommended you make the following change in your medication: increase Asprin to 325 mg daily

## 2012-08-06 NOTE — Assessment & Plan Note (Signed)
Troy Cooke is status post ablation of his atrial flutter. He returned to see Dr. Ladona Ridgel and was in sinus rhythm at that time. Since that time he has converted back into atrial fibrillation. He is basically asymptomatic. He does get fatigued on occasion.  At this point I don't think that a cardioversion alone is going to help much. He clearly was in sinus rhythm his and now is in atrial fibrillation. He is not inclined to start an antiarrhythmic because he overall feels so well. His left atrial size is 47 mL.  His chads score is 0.  We will continue with aspirin 325 mg a day. We'll continue with current locations for rate control. I'll see him again in 6 months for an office visit and EKG.

## 2012-08-06 NOTE — Progress Notes (Signed)
[name] Date of Birth  08-Nov-1945 [facility] [address]     [address] [place], [place]  [address] [phone]  Fax  [phone]  History of Present Illness:  66 year old gentleman with a history of atrial flutter and intermittent atrial fibrillation.   He had ablation of his atrial flutter by Dr. Sharrell Ku in August. He was seen in September for followup visit and was still in sinus rhythm. At some point since then he has converted back into atrial fibrillation. He still basically asymptomatic. He does comment on some fatigue and some shortness of breath but this seems to be chronic problem for him.  He denies any PND orthopnea. He denies any syncope or presyncope.                                                                                                                                                                  Current Outpatient Prescriptions on File Prior to Visit  Medication Sig Dispense Refill  . citalopram (CELEXA) 20 MG tablet Take 1 tablet (20 mg total) by mouth daily. 1/2 DAILY  90 tablet  0  . IBUPROFEN PO Take by mouth as needed.        . potassium chloride (K-DUR) 10 MEQ tablet Take 1 tablet (10 mEq total) by mouth daily.  30 tablet  0  . [DISCONTINUED] digoxin (LANOXIN) 0.25 MG tablet TAKE 1 TABLET EVERY DAY  30 tablet  12  . [DISCONTINUED] KLOR-CON M10 10 MEQ tablet TAKE 1 TABLET EVERY DAY  90 tablet  3  . [DISCONTINUED] lisinopril (PRINIVIL,ZESTRIL) 20 MG tablet Take 20 mg by mouth daily.        . [DISCONTINUED] metoprolol (LOPRESSOR) 50 MG tablet Take 1 tablet (50 mg total) by mouth 2 (two) times daily.  60 tablet  0  . [DISCONTINUED] pravastatin (PRAVACHOL) 40 MG tablet Take 40 mg by mouth daily.          No Known Allergies  Past Medical History  Diagnosis Date  . Hyperlipidemia   . Hypertension   . Atrial fibrillation     HISTORY OF INTERMITTENT ATRIAL FIBRILLATION--    No past surgical history on  file.  History  Smoking status  . Never Smoker   Smokeless tobacco  . Not on file    History  Alcohol Use  . Yes    Family History  Problem Relation Age of Onset  . Hypertension Mother   . Hypertension Father     Reviw of Systems:  Reviewed in the HPI.  All other systems are negative.  Physical Exam: BP 150/90  Pulse 89  Ht 5\' 11"  (1.803 m)  Wt 240 lb (108.863 kg)  BMI 33.47 kg/m2  SpO2 95%  The patient is alert and oriented x 3.  The mood and affect are normal.   Skin: warm and dry.  Color is normal.    HEENT:   the sclera are nonicteric.  The mucous membranes are moist.  The carotids are 2+ without bruits.  There is no thyromegaly.  There is no JVD.    Lungs: clear.  The chest wall is non tender.    Heart: Irregularly irregular  Abdomen: good bowel sounds.  There is no guarding or rebound.  There is no hepatosplenomegaly or tenderness.  There are no masses.   Extremities:  no clubbing, cyanosis, or edema.  The legs are without rashes.  The distal pulses are intact.   Neuro:  Cranial nerves II - XII are intact.  Motor and sensory functions are intact.    The gait is normal.  ECG: 08/06/2012-atrial fibrillation with ventricular rate of 88. He has nonspecific ST and T-wave abnormalities.  Assessment / Plan:

## 2012-10-19 ENCOUNTER — Encounter: Payer: Self-pay | Admitting: Cardiovascular Disease

## 2012-11-09 ENCOUNTER — Encounter: Payer: Self-pay | Admitting: Cardiovascular Disease

## 2012-11-09 ENCOUNTER — Ambulatory Visit (INDEPENDENT_AMBULATORY_CARE_PROVIDER_SITE_OTHER): Payer: Medicare Other | Admitting: Cardiovascular Disease

## 2012-11-09 VITALS — BP 140/100 | HR 85 | Ht 71.0 in | Wt 237.0 lb

## 2012-11-09 DIAGNOSIS — I4891 Unspecified atrial fibrillation: Secondary | ICD-10-CM

## 2012-11-09 MED ORDER — METOPROLOL TARTRATE 100 MG PO TABS: 100.0000 mg | ORAL_TABLET | Freq: Two times a day (BID) | ORAL | Status: DC

## 2012-11-09 MED ORDER — RIVAROXABAN 20 MG PO TABS: 20.0000 mg | ORAL_TABLET | Freq: Every day | ORAL | Status: DC

## 2012-11-09 NOTE — Assessment & Plan Note (Addendum)
Troy Cooke continues to have  intermittent episodes of atrial fibrillation. He has paroxysmal atrial ablation. We will restart his Xarelto 20 mg a day.  Has history of hypertension and borderline diabetes mellitus.  He is slightly symptomatic.

## 2012-11-09 NOTE — Progress Notes (Signed)
[name] Date of Birth  24-Nov-1945 [facility] [address]     [address] [place], [place]  [address] [phone]  Fax  [phone]   Problem List: 1. Hypertension 2. Borderline diabtes Mellitus 3. Atrial fibrillation  History of Present Illness:  67 year old gentleman with a history of atrial flutter and intermittent atrial fibrillation.   He had ablation of his atrial flutter by Dr. Sharrell Ku in August. He was seen in September for followup visit and was still in sinus rhythm. At some point since then he has converted back into atrial fibrillation. He still basically asymptomatic. He does comment on some fatigue and some shortness of breath but this seems to be chronic problem for him.  He denies any PND orthopnea. He denies any syncope or presyncope.  Feb. 28, 2014:  He has continued to have PAF.  He has some fatigue with the AF.   He has had elevated BP readings  Current Outpatient Prescriptions on File Prior to Visit  Medication Sig Dispense Refill  . aspirin 325 MG EC tablet Take 1 tablet (325 mg total) by mouth daily.      . citalopram (CELEXA) 20 MG tablet Take 1 tablet (20 mg total) by mouth daily. 1/2 DAILY  90 tablet  0  . digoxin (LANOXIN) 0.25 MG tablet Take 1 tablet (0.25 mg total) by mouth daily.  90 tablet  3  . hydrochlorothiazide (HYDRODIURIL) 25 MG tablet Take 1 tablet (25 mg total) by mouth daily.  90 tablet  3  . IBUPROFEN PO Take by mouth as needed.        Marland Kitchen lisinopril (PRINIVIL,ZESTRIL) 20 MG tablet Take 1 tablet (20 mg total) by mouth daily.  90 tablet  3  . metoprolol (LOPRESSOR) 50 MG tablet Take 1 tablet (50 mg total) by mouth 2 (two) times daily.  180 tablet  3  . potassium chloride (KLOR-CON M10) 10 MEQ tablet Take 1 tablet (10 mEq total) by mouth daily.  90 tablet  3  . pravastatin (PRAVACHOL) 40 MG tablet Take 1 tablet (40 mg total) by mouth daily.  90 tablet  3  . propranolol (INDERAL) 10 MG tablet Take  10 mg by mouth 3 (three) times daily as needed.       No current facility-administered medications on file prior to visit.    No Known Allergies  Past Medical History  Diagnosis Date  . Hyperlipidemia   . Hypertension   . Atrial fibrillation     HISTORY OF INTERMITTENT ATRIAL FIBRILLATION--    No past surgical history on file.  History  Smoking status  . Never Smoker   Smokeless tobacco  . Not on file    History  Alcohol Use  . Yes    Family History  Problem Relation Age of Onset  . Hypertension Mother   . Hypertension Father     Reviw of Systems:  Reviewed in the HPI.  All other systems are negative.  Physical Exam: BP 140/100  Pulse 85  Ht 5\' 11"  (1.803 m)  Wt 237 lb (107.502 kg)  BMI 33.07 kg/m2 The patient is alert and oriented x 3.  The mood and affect are normal.   Skin: warm and dry.  Color is normal.    HEENT:   the sclera are nonicteric.  The mucous membranes are moist.  The carotids are 2+ without bruits.  There is no thyromegaly.  There is no JVD.    Lungs:  clear.  The chest wall is non tender.    Heart: Irregularly irregular, soft systolic.  Abdomen: good bowel sounds.  There is no guarding or rebound.  There is no hepatosplenomegaly or tenderness.  There are no masses.   Extremities:  no clubbing, cyanosis, or edema.  The legs are without rashes.  The distal pulses are intact.   Neuro:  Cranial nerves II - XII are intact.  Motor and sensory functions are intact.    The gait is normal.  ECG: Fe. 28, 2014 -atrial fibrillation with ventricular rate of 85. He has nonspecific ST and T-wave abnormalities.  Assessment / Plan:

## 2012-11-09 NOTE — Patient Instructions (Signed)
Your physician wants you to follow-up in: 6 months  You will receive a reminder letter in the mail two months in advance. If you don't receive a letter, please call our office to schedule the follow-up appointment.  Your physician has recommended you make the following change in your medication:   Start xarelto 20 mg daily to thin blood due to afib Increase metoprolol to 100 mg twice daily 12 hours apart.  REDUCE HIGH SODIUM FOODS LIKE CANNED SOUP, GRAVY, SAUCES, READY PREPARED FOODS LIKE FROZEN FOODS; LEAN CUISINE, LASAGNA. BACON, SAUSAGE, LUNCH MEAT, FAST FOODS.Marland Kitchen  DASH Diet The DASH diet stands for "Dietary Approaches to Stop Hypertension." It is a healthy eating plan that has been shown to reduce high blood pressure (hypertension) in as little as 14 days, while also possibly providing other significant health benefits. These other health benefits include reducing the risk of breast cancer after menopause and reducing the risk of type 2 diabetes, heart disease, colon cancer, and stroke. Health benefits also include weight loss and slowing kidney failure in patients with chronic kidney disease.  DIET GUIDELINES  Limit salt (sodium). Your diet should contain less than 1500 mg of sodium daily.  Limit refined or processed carbohydrates. Your diet should include mostly whole grains. Desserts and added sugars should be used sparingly.  Include small amounts of heart-healthy fats. These types of fats include nuts, oils, and tub margarine. Limit saturated and trans fats. These fats have been shown to be harmful in the body. CHOOSING FOODS  The following food groups are based on a 2000 calorie diet. See your Registered Dietitian for individual calorie needs. Grains and Grain Products (6 to 8 servings daily)  Eat More Often: Whole-wheat bread, brown rice, whole-grain or wheat pasta, quinoa, popcorn without added fat or salt (air popped).  Eat Less Often: White bread, white pasta, white rice,  cornbread. Vegetables (4 to 5 servings daily)  Eat More Often: Fresh, frozen, and canned vegetables. Vegetables may be raw, steamed, roasted, or grilled with a minimal amount of fat.  Eat Less Often/Avoid: Creamed or fried vegetables. Vegetables in a cheese sauce. Fruit (4 to 5 servings daily)  Eat More Often: All fresh, canned (in natural juice), or frozen fruits. Dried fruits without added sugar. One hundred percent fruit juice ( cup [237 mL] daily).  Eat Less Often: Dried fruits with added sugar. Canned fruit in light or heavy syrup. [employer], Fish, and Poultry (2 servings or less daily. One serving is 3 to 4 oz [85-114 g]).  Eat More Often: Ninety percent or leaner ground beef, tenderloin, sirloin. Round cuts of beef, chicken breast, [place] breast. All fish. Grill, bake, or broil your meat. Nothing should be fried.  Eat Less Often/Avoid: Fatty cuts of meat, [place], or chicken leg, thigh, or wing. Fried cuts of meat or fish. Dairy (2 to 3 servings)  Eat More Often: Low-fat or fat-free milk, low-fat plain or light yogurt, reduced-fat or part-skim cheese.  Eat Less Often/Avoid: Milk (whole, 2%).Whole milk yogurt. Full-fat cheeses. Nuts, Seeds, and Legumes (4 to 5 servings per week)  Eat More Often: All without added salt.  Eat Less Often/Avoid: Salted nuts and seeds, canned beans with added salt. Fats and Sweets (limited)  Eat More Often: Vegetable oils, tub margarines without trans fats, sugar-free gelatin. Mayonnaise and salad dressings.  Eat Less Often/Avoid: Coconut oils, palm oils, butter, stick margarine, cream, half and half, cookies, candy, pie. FOR MORE INFORMATION The Dash Diet Eating Plan: www.dashdiet.org Document Released: 08/18/2011  Document Revised: 11/21/2011 Document Reviewed: 08/18/2011 [facility] Patient Information 2013 [place], [place].

## 2012-11-09 NOTE — Assessment & Plan Note (Signed)
His blood pressure continues to be elevated. I think that he is eating too much salt. He also is not exercising very regularly. I've advised him to work on a good diet, exercise, and weight loss program.  We'll increase his metoprolol to 100 mg twice a day. We'll see him back in 6 months

## 2012-11-28 ENCOUNTER — Telehealth: Payer: Self-pay | Admitting: *Deleted

## 2012-11-28 NOTE — Telephone Encounter (Signed)
spoke to Troy Cooke from pharmacy and states to him that doctor will not fill pt celexa (citalopram). pharmacy agreed. states they will send to pt PCP.

## 2012-12-14 IMAGING — CR DG CHEST 2V
2 series · 2 of 2 positions shown · non-contrast
Comparison: None.

CLINICAL DATA: Atrial fib/flutter for 3 days.

CHEST - 2 VIEW

[w chest pa]
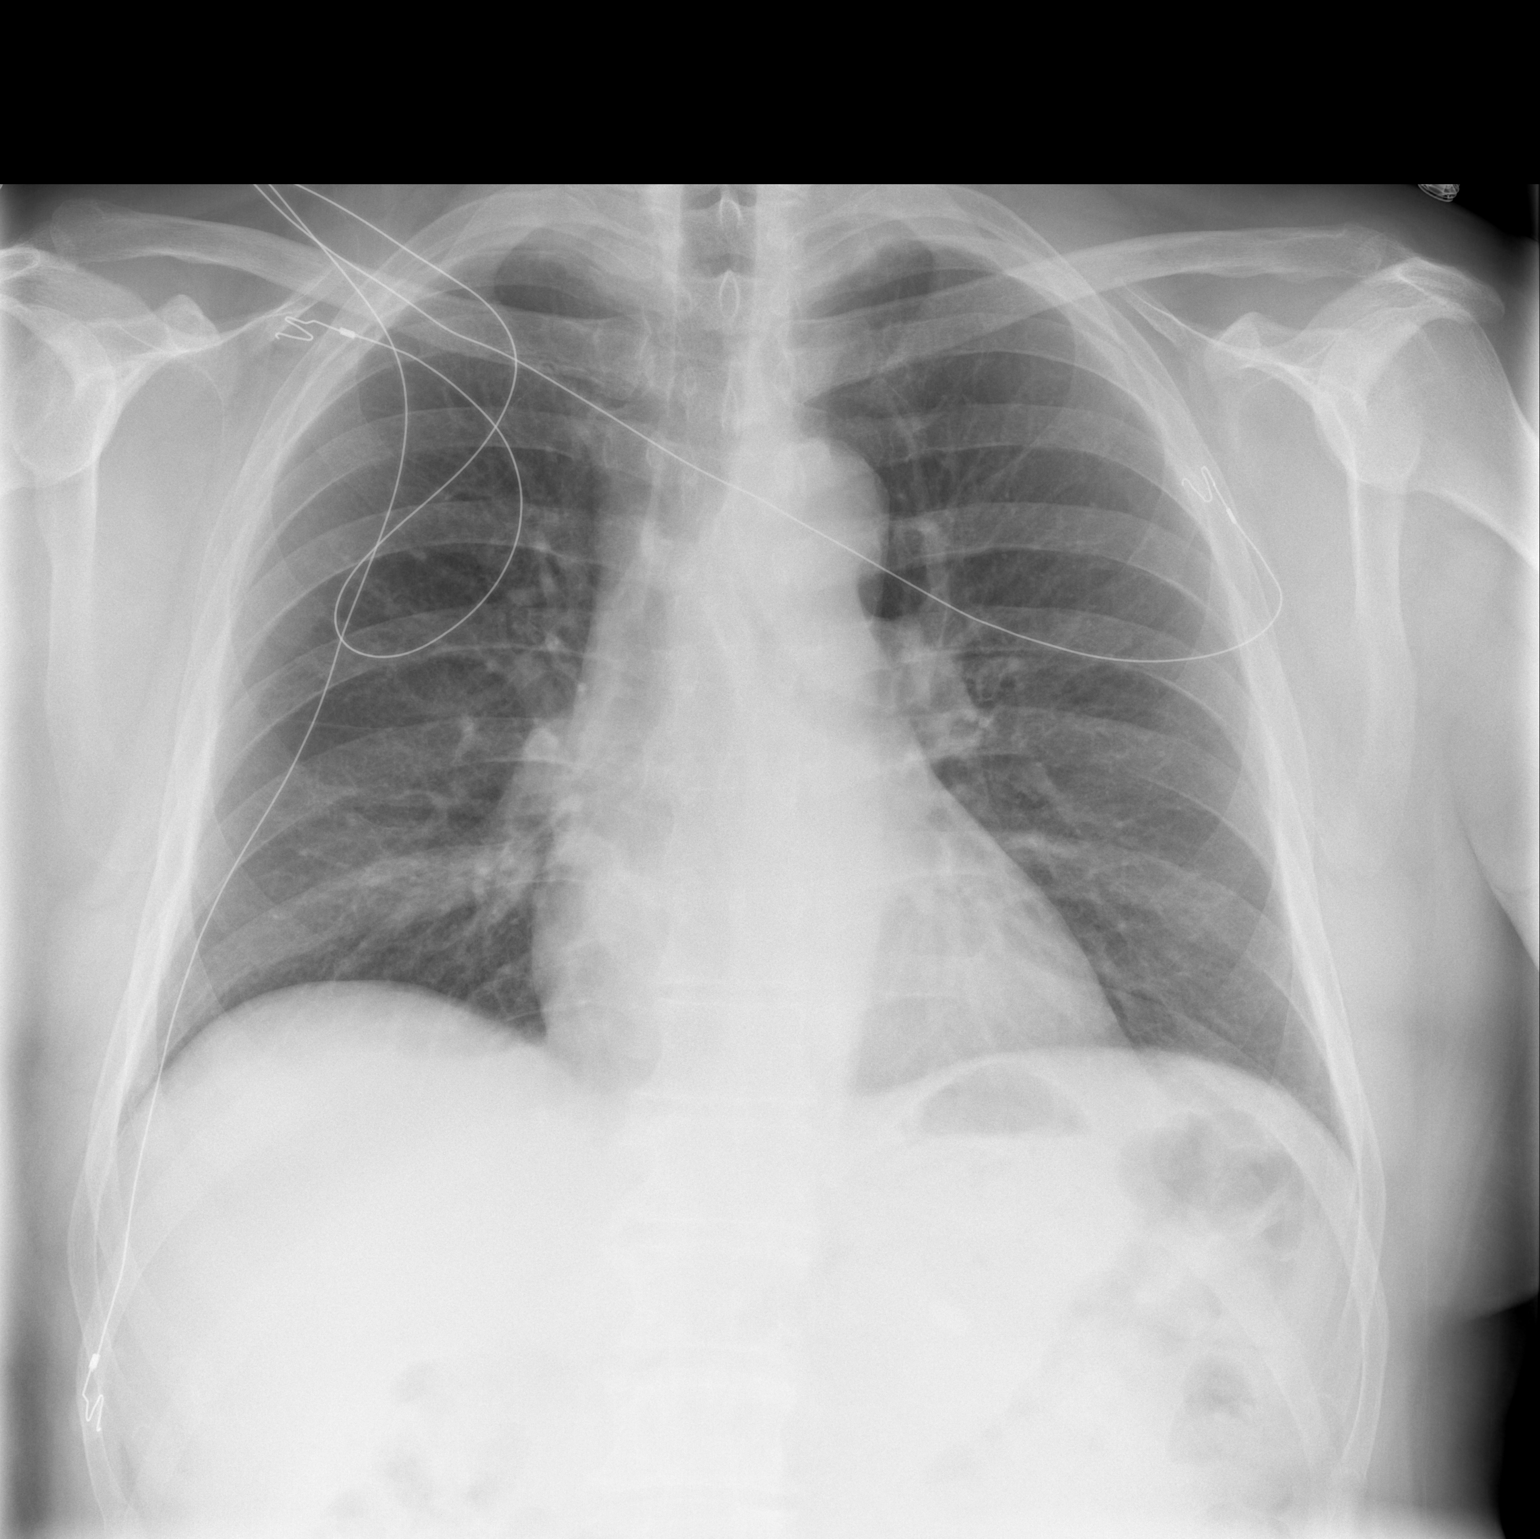

[w chest lat]
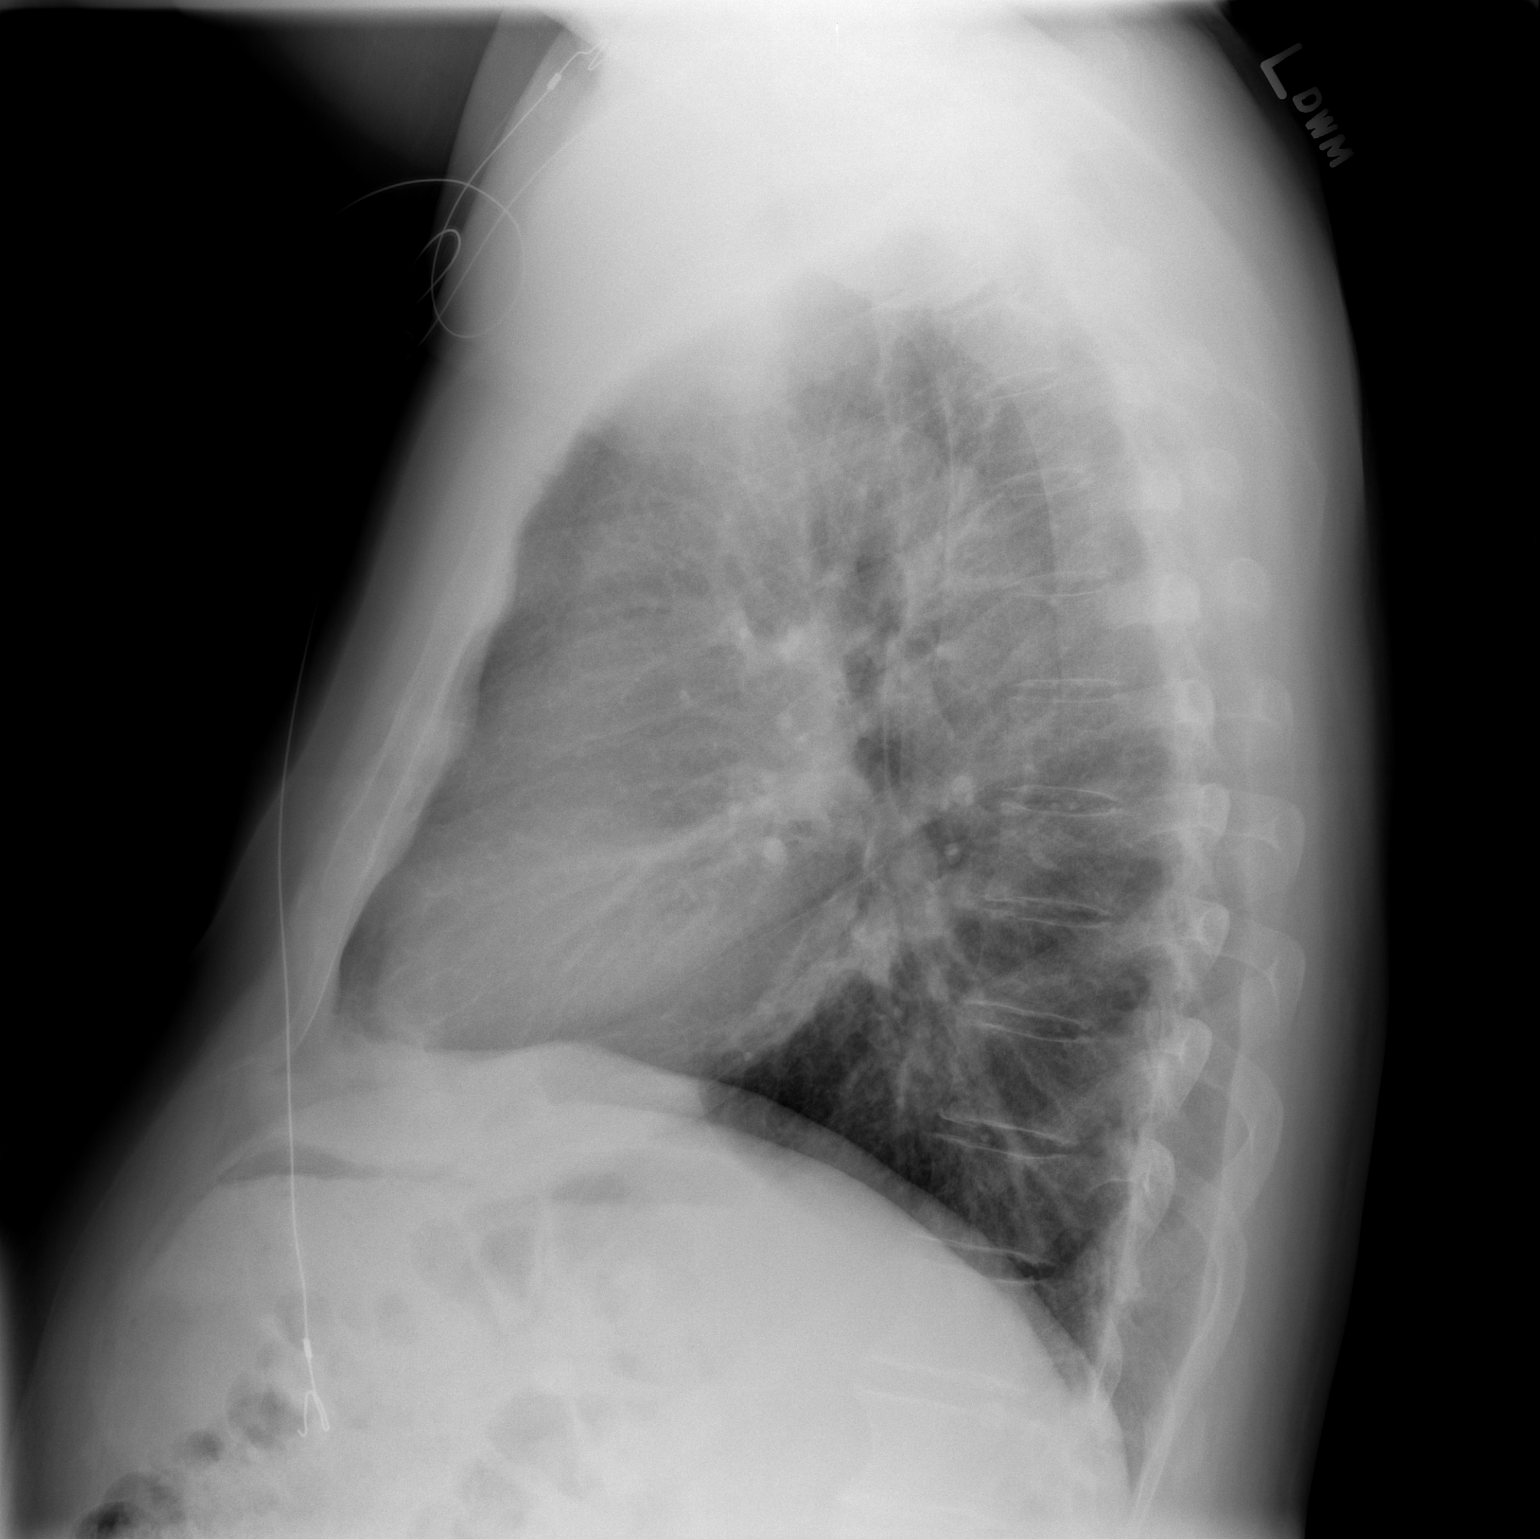

[2 of 2 positions shown; findings below may reference images not displayed]

FINDINGS: The heart is normal in size.  There are mildly
accentuated bronchial markings.  There are no infiltrates or
edematous changes.  There is no evidence for mediastinal or hilar
adenopathy.
IMPRESSION: Mildly accentuated bronchial markings. No acute findings.

## 2013-04-12 ENCOUNTER — Encounter: Payer: Self-pay | Admitting: Cardiovascular Disease

## 2013-05-16 ENCOUNTER — Ambulatory Visit: Payer: Medicare Other | Admitting: Cardiovascular Disease

## 2013-05-26 ENCOUNTER — Other Ambulatory Visit: Payer: Self-pay | Admitting: Cardiovascular Disease

## 2013-05-27 ENCOUNTER — Ambulatory Visit (INDEPENDENT_AMBULATORY_CARE_PROVIDER_SITE_OTHER): Payer: Medicare Other | Admitting: Cardiovascular Disease

## 2013-05-27 ENCOUNTER — Encounter: Payer: Self-pay | Admitting: Cardiovascular Disease

## 2013-05-27 VITALS — BP 144/90 | HR 78 | Ht 71.0 in | Wt 233.0 lb

## 2013-05-27 DIAGNOSIS — I10 Essential (primary) hypertension: Secondary | ICD-10-CM

## 2013-05-27 DIAGNOSIS — E785 Hyperlipidemia, unspecified: Secondary | ICD-10-CM

## 2013-05-27 DIAGNOSIS — I4891 Unspecified atrial fibrillation: Secondary | ICD-10-CM

## 2013-05-27 MED ORDER — ASPIRIN 81 MG PO TBEC: 81.0000 mg | DELAYED_RELEASE_TABLET | Freq: Every day | ORAL | Status: AC

## 2013-05-27 NOTE — Assessment & Plan Note (Signed)
Troy Cooke is doing very well. He has fairly persistent atrial fibrillation. He remains asymptomatic. He remains on Xarelto.    We'll continue the same medications. I've encouraged him to continue with a good diet and exercise program.

## 2013-05-27 NOTE — Assessment & Plan Note (Signed)
Stable.  Continue low salt diet.  Encouraged him to exercise.

## 2013-05-27 NOTE — Progress Notes (Signed)
Troy Cooke Date of Birth  1945-09-28 [facility] [address]     [address] [place], [place]  [address] [phone]  Fax  [phone]   Problem List: 1. Hypertension 2. Borderline diabtes Mellitus 3. Atrial fibrillation  History of Present Illness:  67 year old gentleman with a history of atrial flutter and intermittent atrial fibrillation.   He had ablation of his atrial flutter by Dr. Sharrell Cooke in August. He was seen in September for followup visit and was still in sinus rhythm. At some point since then he has converted back into atrial fibrillation. He still basically asymptomatic. He does comment on some fatigue and some shortness of breath but this seems to be chronic problem for him.  He denies any PND orthopnea. He denies any syncope or presyncope.  Feb. 28, 2014:  He has continued to have PAF.  He has some fatigue with the AF.   He has had elevated BP readings  Sept. 15, 2014:  Troy Cooke is doing well.   He has had some dental work.   Current Outpatient Prescriptions on File Prior to Visit  Medication Sig Dispense Refill  . aspirin 325 MG EC tablet Take 1 tablet (325 mg total) by mouth daily.      . citalopram (CELEXA) 20 MG tablet Take 1 tablet (20 mg total) by mouth daily. 1/2 DAILY  90 tablet  0  . digoxin (LANOXIN) 0.25 MG tablet Take 1 tablet (0.25 mg total) by mouth daily.  90 tablet  3  . hydrochlorothiazide (HYDRODIURIL) 25 MG tablet Take 1 tablet (25 mg total) by mouth daily.  90 tablet  3  . IBUPROFEN PO Take by mouth as needed.        Marland Kitchen lisinopril (PRINIVIL,ZESTRIL) 20 MG tablet Take 1 tablet (20 mg total) by mouth daily.  90 tablet  3  . metoprolol (LOPRESSOR) 100 MG tablet Take 1 tablet (100 mg total) by mouth 2 (two) times daily.  60 tablet  6  . potassium chloride (KLOR-CON M10) 10 MEQ tablet Take 1 tablet (10 mEq total) by mouth daily.  90 tablet  3  . pravastatin (PRAVACHOL) 40 MG tablet Take 1 tablet (40 mg total)  by mouth daily.  90 tablet  3  . XARELTO 20 MG TABS tablet TAKE 1 TABLET (20 MG TOTAL) BY MOUTH DAILY WITH SUPPER.  30 tablet  6  . propranolol (INDERAL) 10 MG tablet Take 10 mg by mouth 3 (three) times daily as needed.       No current facility-administered medications on file prior to visit.    No Known Allergies  Past Medical History  Diagnosis Date  . Hyperlipidemia   . Hypertension   . Atrial fibrillation     HISTORY OF INTERMITTENT ATRIAL FIBRILLATION--    No past surgical history on file.  History  Smoking status  . Never Smoker   Smokeless tobacco  . Not on file    History  Alcohol Use  . Yes    Family History  Problem Relation Age of Onset  . Hypertension Mother   . Hypertension Father     Reviw of Systems:  Reviewed in the HPI.  All other systems are negative.  Physical Exam: BP 144/90  Pulse 78  Ht 5\' 11"  (1.803 m)  Wt 233 lb (105.688 kg)  BMI 32.51 kg/m2 The patient is alert and oriented x 3.  The mood and affect are normal.   Skin: warm and dry.  Color is normal.    HEENT:   the sclera are nonicteric.  The mucous membranes are moist.  The carotids are 2+ without bruits.  There is no thyromegaly.  There is no JVD.    Lungs: clear.  The chest wall is non tender.    Heart: Irregularly irregular, soft systolic murmur.  Abdomen: good bowel sounds.  There is no guarding or rebound.  There is no hepatosplenomegaly or tenderness.  There are no masses.   Extremities:  no clubbing, cyanosis, or edema.  The legs are without rashes.  The distal pulses are intact.   Neuro:  Cranial nerves II - XII are intact.  Motor and sensory functions are intact.    The gait is normal.  ECG:  Assessment / Plan:

## 2013-05-27 NOTE — Patient Instructions (Addendum)
Your physician wants you to follow-up in: 6 months  You will receive a reminder letter in the mail two months in advance. If you don't receive a letter, please call our office to schedule the follow-up appointment.   Your physician has recommended you make the following change in your medication:  Reduce aspirin to 81 mg daily.   

## 2013-06-19 ENCOUNTER — Other Ambulatory Visit: Payer: Self-pay | Admitting: Cardiovascular Disease

## 2013-08-06 ENCOUNTER — Other Ambulatory Visit: Payer: Self-pay | Admitting: *Deleted

## 2013-08-06 DIAGNOSIS — I10 Essential (primary) hypertension: Secondary | ICD-10-CM

## 2013-08-06 DIAGNOSIS — E785 Hyperlipidemia, unspecified: Secondary | ICD-10-CM

## 2013-08-06 DIAGNOSIS — I4891 Unspecified atrial fibrillation: Secondary | ICD-10-CM

## 2013-08-06 MED ORDER — PRAVASTATIN SODIUM 40 MG PO TABS: 40.0000 mg | ORAL_TABLET | Freq: Every day | ORAL | Status: DC

## 2013-08-06 MED ORDER — METOPROLOL TARTRATE 100 MG PO TABS: 100.0000 mg | ORAL_TABLET | Freq: Two times a day (BID) | ORAL | Status: DC

## 2013-08-06 MED ORDER — LISINOPRIL 20 MG PO TABS: 20.0000 mg | ORAL_TABLET | Freq: Every day | ORAL | Status: DC

## 2013-10-18 ENCOUNTER — Other Ambulatory Visit: Payer: Self-pay

## 2013-10-18 DIAGNOSIS — I4891 Unspecified atrial fibrillation: Secondary | ICD-10-CM

## 2013-10-18 DIAGNOSIS — I10 Essential (primary) hypertension: Secondary | ICD-10-CM

## 2013-10-18 DIAGNOSIS — E785 Hyperlipidemia, unspecified: Secondary | ICD-10-CM

## 2013-10-18 MED ORDER — POTASSIUM CHLORIDE CRYS ER 10 MEQ PO TBCR: 10.0000 meq | EXTENDED_RELEASE_TABLET | Freq: Every day | ORAL | Status: AC

## 2013-11-06 ENCOUNTER — Other Ambulatory Visit: Payer: Self-pay

## 2013-11-06 MED ORDER — PROPRANOLOL HCL 10 MG PO TABS: 10.0000 mg | ORAL_TABLET | Freq: Three times a day (TID) | ORAL | Status: AC | PRN

## 2013-11-27 ENCOUNTER — Ambulatory Visit (INDEPENDENT_AMBULATORY_CARE_PROVIDER_SITE_OTHER): Payer: Medicare Other | Admitting: Cardiovascular Disease

## 2013-11-27 ENCOUNTER — Encounter (INDEPENDENT_AMBULATORY_CARE_PROVIDER_SITE_OTHER): Payer: Self-pay

## 2013-11-27 ENCOUNTER — Encounter: Payer: Self-pay | Admitting: Cardiovascular Disease

## 2013-11-27 VITALS — BP 140/98 | HR 101 | Ht 71.0 in | Wt 237.8 lb

## 2013-11-27 DIAGNOSIS — I4892 Unspecified atrial flutter: Secondary | ICD-10-CM

## 2013-11-27 DIAGNOSIS — I10 Essential (primary) hypertension: Secondary | ICD-10-CM

## 2013-11-27 DIAGNOSIS — I4891 Unspecified atrial fibrillation: Secondary | ICD-10-CM

## 2013-11-27 MED ORDER — DILTIAZEM HCL ER COATED BEADS 180 MG PO CP24: 180.0000 mg | ORAL_CAPSULE | Freq: Every day | ORAL | Status: AC

## 2013-11-27 NOTE — Patient Instructions (Signed)
Your physician has recommended you make the following change in your medication:  Start Cardizem 180 mg daily You have 6 months to establish with a new cardiologist // we will send records when you call.

## 2013-11-27 NOTE — Progress Notes (Signed)
[name] Date of Birth  [birth year] [facility] [address]     [address] [place], [place]  [address] [phone]  Fax  [phone]   Problem List: 1. Hypertension 2. Borderline diabtes Mellitus 3. Atrial fibrillation  History of Present Illness:  68 year old gentleman with a history of atrial flutter and intermittent atrial fibrillation.   He had ablation of his atrial flutter by Dr. Sharrell Ku in August. He was seen in September for followup visit and was still in sinus rhythm. At some point since then he has converted back into atrial fibrillation. He still basically asymptomatic. He does comment on some fatigue and some shortness of breath but this seems to be chronic problem for him.  He denies any PND orthopnea. He denies any syncope or presyncope.  Feb. 28, 2014:  He has continued to have PAF.  He has some fatigue with the AF.   He has had elevated BP readings  Sept. 15, 2014:  Troy Cooke is doing well.   He has had some dental work.   November 27, 2013:  Troy Cooke is doing OK.  Still in A-Fib.   Hi sBP has been elevated as well. No CP or dyspnea.  Plays golf regularly.  His medical Dr. has doubled the metoprolol and the lisinopril since I last saw him.   He has moved down to [facility] and will be establishing his medical care there over the next several months.  Current Outpatient Prescriptions on File Prior to Visit  Medication Sig Dispense Refill  . aspirin 81 MG EC tablet Take 1 tablet (81 mg total) by mouth daily.      Marland Kitchen DIGOX 250 MCG tablet TAKE 1 TABLET BY MOUTH EVERY DAY  30 tablet  11  . hydrochlorothiazide (HYDRODIURIL) 25 MG tablet Take 1 tablet (25 mg total) by mouth daily.  90 tablet  3  . IBUPROFEN PO Take by mouth as needed.        . metoprolol (LOPRESSOR) 100 MG tablet Take 1 tablet (100 mg total) by mouth 2 (two) times daily.  180 tablet  1  . potassium chloride (KLOR-CON M10) 10 MEQ tablet Take 1 tablet (10 mEq total)  by mouth daily.  90 tablet  3  . pravastatin (PRAVACHOL) 40 MG tablet Take 1 tablet (40 mg total) by mouth daily.  90 tablet  1  . propranolol (INDERAL) 10 MG tablet Take 1 tablet (10 mg total) by mouth 3 (three) times daily as needed.  90 tablet  3  . XARELTO 20 MG TABS tablet TAKE 1 TABLET (20 MG TOTAL) BY MOUTH DAILY WITH SUPPER.  30 tablet  6   No current facility-administered medications on file prior to visit.    No Known Allergies  Past Medical History  Diagnosis Date  . Hyperlipidemia   . Hypertension   . Atrial fibrillation     HISTORY OF INTERMITTENT ATRIAL FIBRILLATION--    No past surgical history on file.  History  Smoking status  . Never Smoker   Smokeless tobacco  . Not on file    History  Alcohol Use  . Yes    Family History  Problem Relation Age of Onset  . Hypertension Mother   . Hypertension Father     Reviw of Systems:  Reviewed in the HPI.  All other systems are negative.  Physical Exam: BP 140/98  Pulse 101  Ht 5\' 11"  (1.803 m)  Wt 237 lb 12.8 oz (  107.865 kg)  BMI 33.18 kg/m2 The patient is alert and oriented x 3.  The mood and affect are normal.   Skin: warm and dry.  Color is normal.    HEENT:   the sclera are nonicteric.  The mucous membranes are moist.  The carotids are 2+ without bruits.  There is no thyromegaly.  There is no JVD.    Lungs: clear.  The chest wall is non tender.    Heart: Irregularly irregular, soft systolic murmur.  Abdomen: good bowel sounds.  There is no guarding or rebound.  There is no hepatosplenomegaly or tenderness.  There are no masses.   Extremities:  no clubbing, cyanosis, or edema.  The legs are without rashes.  The distal pulses are intact.   Neuro:  Cranial nerves II - XII are intact.  Motor and sensory functions are intact.    The gait is normal.  ECG:  Assessment / Plan:

## 2013-11-27 NOTE — Assessment & Plan Note (Signed)
Troy Cooke remains in atrial fibrillation. He is rate remains elevated. We'll add Cardizem CD 180 mg a day. This should help his tachycardia as well as his elevated blood pressure.  Have advised [place]  the next [place] months. We'll be happy to see him if he wants to make the drive up here

## 2013-12-11 ENCOUNTER — Other Ambulatory Visit: Payer: Self-pay | Admitting: Cardiovascular Disease

## 2013-12-11 NOTE — Telephone Encounter (Signed)
Is this ok to refill? I see at his last visit is says that it was d/c'd, but the note also states that his medical doctor doubled his dose. Please advise. Thanks, MI

## 2013-12-13 ENCOUNTER — Other Ambulatory Visit: Payer: Self-pay

## 2013-12-13 MED ORDER — LISINOPRIL 20 MG PO TABS: 20.0000 mg | ORAL_TABLET | Freq: Two times a day (BID) | ORAL | Status: DC

## 2013-12-25 ENCOUNTER — Other Ambulatory Visit: Payer: Self-pay | Admitting: Cardiovascular Disease

## 2014-01-12 ENCOUNTER — Other Ambulatory Visit: Payer: Self-pay | Admitting: Cardiovascular Disease

## 2014-02-04 ENCOUNTER — Other Ambulatory Visit: Payer: Self-pay | Admitting: Cardiovascular Disease

## 2014-02-07 ENCOUNTER — Other Ambulatory Visit: Payer: Self-pay | Admitting: Cardiovascular Disease

## 2014-02-10 ENCOUNTER — Other Ambulatory Visit: Payer: Self-pay | Admitting: *Deleted

## 2014-02-10 DIAGNOSIS — I10 Essential (primary) hypertension: Secondary | ICD-10-CM

## 2014-02-10 DIAGNOSIS — I4891 Unspecified atrial fibrillation: Secondary | ICD-10-CM

## 2014-02-10 DIAGNOSIS — E785 Hyperlipidemia, unspecified: Secondary | ICD-10-CM

## 2014-02-10 MED ORDER — PRAVASTATIN SODIUM 40 MG PO TABS: 40.0000 mg | ORAL_TABLET | Freq: Every day | ORAL | Status: AC

## 2014-02-21 ENCOUNTER — Other Ambulatory Visit: Payer: Self-pay | Admitting: Cardiovascular Disease

## 2014-06-22 ENCOUNTER — Other Ambulatory Visit: Payer: Self-pay | Admitting: Cardiovascular Disease

## 2014-06-24 ENCOUNTER — Other Ambulatory Visit: Payer: Self-pay

## 2014-06-24 MED ORDER — DIGOXIN 250 MCG PO TABS: ORAL_TABLET | ORAL | Status: AC

## 2019-03-07 ENCOUNTER — Encounter: Payer: Self-pay | Admitting: Internal Medicine
